# Patient Record
Sex: Male | Born: 1995 | Race: White | Hispanic: No | Marital: Married | Smoking: Never smoker
Health system: Southern US, Community
[De-identification: ages and names within clinical notes are randomized; demographics above are authoritative.]

---
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
## 2017-04-09 ENCOUNTER — Ambulatory Visit: Payer: Self-pay | Admitting: Medical

## 2017-04-09 DIAGNOSIS — Z021 Encounter for pre-employment examination: Secondary | ICD-10-CM

## 2017-04-09 DIAGNOSIS — E785 Hyperlipidemia, unspecified: Secondary | ICD-10-CM

## 2017-04-09 DIAGNOSIS — H9192 Unspecified hearing loss, left ear: Secondary | ICD-10-CM

## 2017-04-09 NOTE — Progress Notes (Signed)
   Subjective:    Patient ID: Thomas Keith, male    DOB: 15-Jan-1996, 22 y.o.   MRN: [id number]  HPI  22 yo male in non acute distress here for [employer] physical. 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    144/82 HR70  Review of Systems  Constitutional: Negative for chills and fever.  HENT: Negative for congestion, ear pain and sore throat.   Respiratory: Negative for cough and shortness of breath.   Cardiovascular: Negative for chest pain.  Genitourinary: Negative for dysuria.  Musculoskeletal: Negative for myalgias.  Skin: Negative for rash.  Neurological: Negative for dizziness, syncope and light-headedness.       Objective:   Physical Exam  Constitutional: He is oriented to person, place, and time. Vital signs are normal. He appears well-developed and well-nourished.  HENT:  Head: Normocephalic and atraumatic.  Right Ear: External ear normal.  Left Ear: External ear normal.  Nose: Nose normal.  Mouth/Throat: Oropharynx is clear and moist.  Eyes: Conjunctivae, EOM and lids are normal. Pupils are equal, round, and reactive to light.  Neck: Trachea normal and normal range of motion. Neck supple. No thyromegaly present.  Cardiovascular: Normal rate, regular rhythm and normal heart sounds.  Pulmonary/Chest: Effort normal and breath sounds normal.  Abdominal: Soft. Bowel sounds are normal.  Musculoskeletal: Normal range of motion.  Lymphadenopathy:    He has no cervical adenopathy.  Neurological: He is alert and oriented to person, place, and time. He has normal strength. No cranial nerve deficit or sensory deficit. He displays a negative Romberg sign. GCS eye subscore is 4. GCS verbal subscore is 5. GCS motor subscore is 6.  Reflex Scores:      Patellar reflexes are 2+ on the right side and 2+ on the left side.      Achilles reflexes are 2+ on the right side and 2+ on the left side. Skin: Skin is warm, dry and  intact.  Psychiatric: He has a normal mood and affect. His speech is normal and behavior is normal. Judgment and thought content normal. Cognition and memory are normal.  Nursing note and vitals reviewed.   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      Assessment & Plan:  Sheriff's Physical pre-employment  Reviewed labs with patient, Hyperlipidemia Decreased hearing left ear per our hearing test.Will get clearance letter. Patient to bring in clearance note for hearing from the [employer]. Will review and discuss with Dr. Sullivan LoneGilbert. 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 Audiology of the left ear on exam here abnormal. Information given to patient on cholesterol.  GF [place] [employer] [place]

## 2017-04-09 NOTE — Patient Instructions (Signed)

## 2017-07-15 ENCOUNTER — Encounter

## 2017-07-15 ENCOUNTER — Ambulatory Visit: Payer: Self-pay | Admitting: Family Medicine

## 2017-08-12 ENCOUNTER — Ambulatory Visit: Payer: Self-pay | Admitting: Family Medicine

## 2018-11-24 ENCOUNTER — Encounter: Payer: Self-pay | Admitting: Adult Health

## 2018-12-13 ENCOUNTER — Other Ambulatory Visit: Payer: Self-pay

## 2018-12-13 ENCOUNTER — Encounter: Payer: Self-pay | Admitting: Adult Health

## 2018-12-13 ENCOUNTER — Ambulatory Visit: Payer: Managed Care, Other (non HMO) | Admitting: Adult Health

## 2018-12-13 VITALS — BP 138/62 | HR 77 | Temp 97.2°F | Resp 16 | Ht 66.0 in | Wt 236.0 lb

## 2018-12-13 DIAGNOSIS — Z0189 Encounter for other specified special examinations: Secondary | ICD-10-CM | POA: Diagnosis not present

## 2018-12-13 DIAGNOSIS — Z008 Encounter for other general examination: Secondary | ICD-10-CM

## 2018-12-13 NOTE — Patient Instructions (Addendum)
I will have the office call you on your glucose and cholesterol results when they return if you have not heard within 1 week please call the office.  This biometric physical is a brief physical and the only labs done are glucose and your lipid panel(cholesterol) and is  not a substitute for seeing a primary care provider for a complete annual physical. Please see a primary care physician for routine health maintenance, labs and full physical at least yearly and follow up as recommended by your provider. Provider also recommends if you do not have a primary care provider for patient to establish care as soon as possible .Patient may chose provider of choice. Also gave the [place]  PHYSICIAN/PROVIDER  REFERRAL LINE at [phone] or web site at [email or web address] to help assist with finding a primary care doctor.  Patient verbalizes understanding that his office is acute care only and not a substitute for a primary care or for the management of chronic conditions.    The Biometric exam is a brief physical with labs including glucose and cholesterol. This does not replace a full physical with a primary care provider, and additional recommended labs. This is an acute care clinic not for maintenance of chronic or long standing conditions.   Provider also recommends if you do not have a primary care provider for patient to establish care promptly.You can choose any provider of your choice at any facility of your choice, the below information is  just a resource to aid in you finding a primary care provider for routine health maintenance.   [place]  PHYSICIAN/PROVIDER  REFERRAL LINE at [phone]  [email or web address] to help assist with finding a primary care doctor.   Helpful resources below of other primary care office's accepting new patients.   [facility]         [address]  [place]. [place] [address] [phone]  . [facility]    [address], [address] [place], [place] [address] [phone]  . [facility] [address]  [place], [place]  [phone]   . [facility]  [address] [place] [address] [phone]    Follow up with primary care as needed for chronic and maintenance health care- can be seen in this employee clinic for acute care.   I will have the office call you on your glucose and cholesterol results when they return if you have not heard within 1 week please call the office.  This biometric physical is a brief physical and the only labs done are glucose and your lipid panel(cholesterol) and is  not a substitute for seeing a primary care provider for a complete annual physical. Please see a primary care physician for routine health maintenance, labs and full physical at least yearly and follow up as recommended by your provider. Provider also recommends if you do not have a primary care provider for patient to establish care as soon as possible .Patient may chose provider of choice. Also gave the [place]  PHYSICIAN/PROVIDER  REFERRAL LINE at [phone] or web site at [email or web address] to help assist with finding a primary care doctor.  Patient verbalizes understanding that his office is acute care only and not a substitute for a primary care or for the management of chronic conditions.    Health Maintenance, Male Adopting a healthy lifestyle and getting preventive  care are important in promoting health and wellness. Ask your health care provider about:  The right schedule for you to have regular tests and exams.  Things you can do on your own to prevent diseases and keep yourself healthy. What should I know about diet, weight, and exercise? Eat a healthy diet   Eat a diet that includes plenty of vegetables, fruits, low-fat dairy products, and lean protein.  Do not eat a lot of foods that are high in  solid fats, added sugars, or sodium. Maintain a healthy weight Body mass index (BMI) is a measurement that can be used to identify possible weight problems. It estimates body fat based on height and weight. Your health care provider can help determine your BMI and help you achieve or maintain a healthy weight. Get regular exercise Get regular exercise. This is one of the most important things you can do for your health. Most adults should:  Exercise for at least 150 minutes each week. The exercise should increase your heart rate and make you sweat (moderate-intensity exercise).  Do strengthening exercises at least twice a week. This is in addition to the moderate-intensity exercise.  Spend less time sitting. Even light physical activity can be beneficial. Watch cholesterol and blood lipids Have your blood tested for lipids and cholesterol at 24 years of age, then have this test every 5 years. You may need to have your cholesterol levels checked more often if:  Your lipid or cholesterol levels are high.  You are older than 24 years of age.  You are at high risk for heart disease. What should I know about cancer screening? Many types of cancers can be detected early and may often be prevented. Depending on your health history and family history, you may need to have cancer screening at various ages. This may include screening for:  Colorectal cancer.  Prostate cancer.  Skin cancer.  Lung cancer. What should I know about heart disease, diabetes, and high blood pressure? Blood pressure and heart disease  High blood pressure causes heart disease and increases the risk of stroke. This is more likely to develop in people who have high blood pressure readings, are of African descent, or are overweight.  Talk with your health care provider about your target blood pressure readings.  Have your blood pressure checked: ? Every 3-5 years if you are 8-38 years of age. ? Every year if you  are 15 years old or older.  If you are between the ages of 93 and 33 and are a current or former smoker, ask your health care provider if you should have a one-time screening for abdominal aortic aneurysm (AAA). Diabetes Have regular diabetes screenings. This checks your fasting blood sugar level. Have the screening done:  Once every three years after age 27 if you are at a normal weight and have a low risk for diabetes.  More often and at a younger age if you are overweight or have a high risk for diabetes. What should I know about preventing infection? Hepatitis B If you have a higher risk for hepatitis B, you should be screened for this virus. Talk with your health care provider to find out if you are at risk for hepatitis B infection. Hepatitis C Blood testing is recommended for:  Everyone born from [birth year] through 1966.  Anyone with known risk factors for hepatitis C. Sexually transmitted infections (STIs)  You should be screened each year for STIs, including gonorrhea and chlamydia, if: ? You  are sexually active and are younger than 24 years of age. ? You are older than 24 years of age and your health care provider tells you that you are at risk for this type of infection. ? Your sexual activity has changed since you were last screened, and you are at increased risk for chlamydia or gonorrhea. Ask your health care provider if you are at risk.  Ask your health care provider about whether you are at high risk for HIV. Your health care provider may recommend a prescription medicine to help prevent HIV infection. If you choose to take medicine to prevent HIV, you should first get tested for HIV. You should then be tested every 3 months for as long as you are taking the medicine. Follow these instructions at home: Lifestyle  Do not use any products that contain nicotine or tobacco, such as cigarettes, e-cigarettes, and chewing tobacco. If you need help quitting, ask your health care  provider.  Do not use street drugs.  Do not share needles.  Ask your health care provider for help if you need support or information about quitting drugs. Alcohol use  Do not drink alcohol if your health care provider tells you not to drink.  If you drink alcohol: ? Limit how much you have to 0-2 drinks a day. ? Be aware of how much alcohol is in your drink. In the [place] one drink equals one 12 oz bottle of beer (355 mL), one 5 oz glass of wine (148 mL), or one 1 oz glass of hard liquor (44 mL). General instructions  Schedule regular health, dental, and eye exams.  Stay current with your vaccines.  Tell your health care provider if: ? You often feel depressed. ? You have ever been abused or do not feel safe at home. Summary  Adopting a healthy lifestyle and getting preventive care are important in promoting health and wellness.  Follow your health care provider's instructions about healthy diet, exercising, and getting tested or screened for diseases.  Follow your health care provider's instructions on monitoring your cholesterol and blood pressure. This information is not intended to replace advice given to you by your health care provider. Make sure you discuss any questions you have with your health care provider. Document Released: 08/30/2007 Document Revised: 02/24/2018 Document Reviewed: 02/24/2018 Elsevier Patient Education  2020 [employer]

## 2018-12-13 NOTE — Progress Notes (Signed)
[place] DOB: 23 y.o. MRN: [id number]  Subjective:  Here for Biometric Screen/brief exam Patient is a 23 year old male in no acute distress who comes to the clinic for his biometric brief exam and biometric screening.   He reports he has gained 20 lbs since the start of the Covid 19 pandemic.  He reports his gym was closed but he has in the past two weeks started a home work out with [employer].  He does not smoke or vape and reports never has.   Patient  denies any fever, body aches,chills, rash, chest pain, shortness of breath, nausea, vomiting, or diarrhea.  He denies any concerns at today's visit.  He sees primary care regularly for health maintenance.  Objective: Blood pressure 138/62, pulse 77, temperature (!) 97.2 F (36.2 C), temperature source Temporal, resp. rate 16, height 5\' 6"  (1.676 m), weight 236 lb (107 kg), SpO2 98 %. NAD, well developed, well nourished HEENT: Within normal limits Neck: Normal, supple, no cervical lymphadenopathy  Heart: Regular rate and rhythm without murmurs, rubs and gallops  Lungs: Clear to auscultation without ant adventitious lung sounds  Assessment: Biometric screen   ICD-10-CM   1. Encounter for other general examination-not full annual physical- biometric screening and brief exam   Z00.8 Glucose, random    Lipid Panel With LDL/HDL Ratio  2. Encounter for biometric screening  Z01.89 Glucose, random    Lipid Panel With LDL/HDL Ratio     Plan:  Fasting glucose and lipids.- labs not fasting he ate  Discussed with patient that today's visit here is a limited biometric screening visit (not a comprehensive exam or management of any chronic problems) Discussed some health issues, including healthy eating habits and exercise. Encouraged to follow-up with PCP for annual comprehensive preventive and wellness care (and if applicable, any chronic issues). Questions invited and  answered.   I will have the office call you on your glucose and cholesterol results when they return if you have not heard within 1 week please call the office.  This biometric physical is a brief physical and the only labs done are glucose and your lipid panel(cholesterol) and is  not a substitute for seeing a primary care provider for a complete annual physical. Please see a primary care physician for routine health maintenance, labs and full physical at least yearly and follow up as recommended by your provider. Provider also recommends if you do not have a primary care provider for patient to establish care as soon as possible .Patient may chose provider of choice. Also gave the [place] at [phone] or web site at [email or web address] to help assist with finding a primary care doctor.  Patient verbalizes understanding that his office is acute care only and not a substitute for a primary care or for the management of chronic conditions.

## 2018-12-13 NOTE — Progress Notes (Signed)
The patient was not fasting for his lab draw

## 2018-12-14 ENCOUNTER — Encounter: Payer: Self-pay | Admitting: Adult Health

## 2018-12-14 LAB — LIPID PANEL WITH LDL/HDL RATIO
Cholesterol, Total: 254 mg/dL — ABNORMAL HIGH (ref 100–199)
HDL: 33 mg/dL — ABNORMAL LOW (ref >39)
LDL Chol Calc (NIH): 184 mg/dL — ABNORMAL HIGH (ref 0–99)
LDL/HDL Ratio: 5.6 ratio — ABNORMAL HIGH (ref 0.0–3.6)
Triglycerides: 194 mg/dL — ABNORMAL HIGH (ref 0–149)
VLDL Cholesterol Cal: 37 mg/dL (ref 5–40)

## 2018-12-14 LAB — GLUCOSE, RANDOM: Glucose: 97 mg/dL (ref 65–99)

## 2018-12-15 ENCOUNTER — Encounter: Payer: Self-pay | Admitting: Adult Health

## 2019-10-26 ENCOUNTER — Encounter: Payer: Self-pay | Admitting: Nurse Practitioner

## 2019-10-26 ENCOUNTER — Ambulatory Visit: Payer: Managed Care, Other (non HMO) | Admitting: Nurse Practitioner

## 2019-10-26 ENCOUNTER — Other Ambulatory Visit: Payer: Self-pay

## 2019-10-26 VITALS — BP 138/88 | HR 76 | Temp 98.1°F | Resp 16 | Ht 68.0 in | Wt 228.0 lb

## 2019-10-26 DIAGNOSIS — Z008 Encounter for other general examination: Secondary | ICD-10-CM

## 2019-10-26 DIAGNOSIS — Z0000 Encounter for general adult medical examination without abnormal findings: Secondary | ICD-10-CM | POA: Diagnosis not present

## 2019-10-26 NOTE — Progress Notes (Signed)
  Subjective:     Patient ID: Thomas Keith, male   DOB: [birth year], 24 y.o.   MRN: [id number]  HPI Thomas Keith is a 24 y.o. male employed by the [employer]. He has been working there for 3 years. Employee is here today for his annual biometric screening exam. He denies any problems today. He reports being on an exercise and weight program and has lost about 30 pounds.   PMH: Fracture right hand  Surgery: none SH:  Denies use of tobacco, ETOH or drugs  Meds: None Allergies: None   Immunizations: Has not had Covid vaccine yet  Diet/Exercise: currently on weight loss program with diet and exercise.  Review of Systems  All other systems reviewed and are negative. Employee denies any problems today.     Objective:BP 138/88 (BP Location: Right Arm, Patient Position: Sitting, Cuff Size: Normal)   Pulse 76   Temp 98.1 F (36.7 C) (Temporal)   Resp 16   Ht 5\' 8"  (1.727 m)   Wt 228 lb (103.4 kg)   SpO2 98%   BMI 34.67 kg/m     Physical Exam Constitutional:      General: He is not in acute distress.    Appearance: Normal appearance.  HENT:     Head: Normocephalic and atraumatic.     Right Ear: Tympanic membrane and ear canal normal.     Left Ear: Tympanic membrane normal.     Nose: Nose normal.     Mouth/Throat:     Mouth: Mucous membranes are moist.     Pharynx: Oropharynx is clear.  Eyes:     Conjunctiva/sclera: Conjunctivae normal.  Neck:     Thyroid: No thyroid mass or thyroid tenderness.  Cardiovascular:     Rate and Rhythm: Normal rate and regular rhythm.  Pulmonary:     Effort: Pulmonary effort is normal.     Breath sounds: Normal breath sounds and air entry.  Abdominal:     Palpations: Abdomen is soft.     Tenderness: There is no abdominal tenderness.  Musculoskeletal:        General: Normal range of motion.     Cervical back: Normal range of motion. No tenderness.  Skin:    General: Skin is warm and dry.  Neurological:     Mental Status: He  is alert.     Motor: No weakness or pronator drift.     Coordination: Romberg sign negative.     Gait: Gait is intact.     Comments: Ambulatory with steady gait, stands on one foot without difficulty. Radial pulses 2+, grips are equal.   Psychiatric:        Mood and Affect: Mood normal.        Thought Content: Thought content normal.        Assessment:    23 y.o. male here for annual biometric screening exam. Limited exam today is normal.     Plan:    Discussed with the employee diet and exercise. He has received a letter giving him information on the Covid vaccine and where to get it. Employee given opportunity to ask questions. All questions answered and instructions given to employee verbally and in witting. Employee to return in one year or sooner if needed.

## 2019-10-26 NOTE — Patient Instructions (Signed)
Exercising to Stay Healthy To become healthy and stay healthy, it is recommended that you do moderate-intensity and vigorous-intensity exercise. You can tell that you are exercising at a moderate intensity if your heart starts beating faster and you start breathing faster but can still hold a conversation. You can tell that you are exercising at a vigorous intensity if you are breathing much harder and faster and cannot hold a conversation while exercising. Exercising regularly is important. It has many health benefits, such as:  Improving overall fitness, flexibility, and endurance.  Increasing bone density.  Helping with weight control.  Decreasing body fat.  Increasing muscle strength.  Reducing stress and tension.  Improving overall health. How often should I exercise? Choose an activity that you enjoy, and set realistic goals. Your health care provider can help you make an activity plan that works for you. Exercise regularly as told by your health care provider. This may include:  Doing strength training two times a week, such as: ? Lifting weights. ? Using resistance bands. ? Push-ups. ? Sit-ups. ? Yoga.  Doing a certain intensity of exercise for a given amount of time. Choose from these options: ? A total of 150 minutes of moderate-intensity exercise every week. ? A total of 75 minutes of vigorous-intensity exercise every week. ? A mix of moderate-intensity and vigorous-intensity exercise every week. Children, pregnant women, people who have not exercised regularly, people who are overweight, and older adults may need to talk with a health care provider about what activities are safe to do. If you have a medical condition, be sure to talk with your health care provider before you start a new exercise program. What are some exercise ideas? Moderate-intensity exercise ideas include:  Walking 1 mile (1.6 km) in about 15  minutes.  Biking.  Hiking.  Golfing.  Dancing.  Water aerobics. Vigorous-intensity exercise ideas include:  Walking 4.5 miles (7.2 km) or more in about 1 hour.  Jogging or running 5 miles (8 km) in about 1 hour.  Biking 10 miles (16.1 km) or more in about 1 hour.  Lap swimming.  Roller-skating or in-line skating.  Cross-country skiing.  Vigorous competitive sports, such as football, basketball, and soccer.  Jumping rope.  Aerobic dancing. What are some everyday activities that can help me to get exercise?  Yard work, such as: ? Pushing a lawn mower. ? Raking and bagging leaves.  Washing your car.  Pushing a stroller.  Shoveling snow.  Gardening.  Washing windows or floors. How can I be more active in my day-to-day activities?  Use stairs instead of an elevator.  Take a walk during your lunch break.  If you drive, park your car farther away from your work or school.  If you take public transportation, get off one stop early and walk the rest of the way.  Stand up or walk around during all of your indoor phone calls.  Get up, stretch, and walk around every 30 minutes throughout the day.  Enjoy exercise with a friend. Support to continue exercising will help you keep a regular routine of activity. What guidelines can I follow while exercising?  Before you start a new exercise program, talk with your health care provider.  Do not exercise so much that you hurt yourself, feel dizzy, or get very short of breath.  Wear comfortable clothes and wear shoes with good support.  Drink plenty of water while you exercise to prevent dehydration or heat stroke.  Work out until your breathing   and your heartbeat get faster. Where to find more information  [place] Department of Health and Human Services: www.hhs.gov  Centers for Disease Control and Prevention (CDC): www.cdc.gov Summary  Exercising regularly is important. It will improve your overall fitness,  flexibility, and endurance.  Regular exercise also will improve your overall health. It can help you control your weight, reduce stress, and improve your bone density.  Do not exercise so much that you hurt yourself, feel dizzy, or get very short of breath.  Before you start a new exercise program, talk with your health care provider. This information is not intended to replace advice given to you by your health care provider. Make sure you discuss any questions you have with your health care provider. Document Revised: 02/13/2017 Document Reviewed: 01/22/2017 Elsevier Patient Education  2020 [employer] Healthy Eating Following a healthy eating pattern may help you to achieve and maintain a healthy body weight, reduce the risk of chronic disease, and live a long and productive life. It is important to follow a healthy eating pattern at an appropriate calorie level for your body. Your nutritional needs should be met primarily through food by choosing a variety of nutrient-rich foods. What are tips for following this plan? Reading food labels  Read labels and choose the following: ? Reduced or low sodium. ? Juices with 100% fruit juice. ? Foods with low saturated fats and high polyunsaturated and monounsaturated fats. ? Foods with whole grains, such as whole wheat, cracked wheat, brown rice, and wild rice. ? Whole grains that are fortified with folic acid. This is recommended for women who are pregnant or who want to become pregnant.  Read labels and avoid the following: ? Foods with a lot of added sugars. These include foods that contain brown sugar, corn sweetener, corn syrup, dextrose, fructose, glucose, high-fructose corn syrup, honey, invert sugar, lactose, malt syrup, maltose, molasses, raw sugar, sucrose, trehalose, or turbinado sugar.  Do not eat more than the following amounts of added sugar per day:  6 teaspoons (25 g) for women.  9 teaspoons (38 g) for men. ? Foods that  contain processed or refined starches and grains. ? Refined grain products, such as white flour, degermed cornmeal, white bread, and white rice. Shopping  Choose nutrient-rich snacks, such as vegetables, whole fruits, and nuts. Avoid high-calorie and high-sugar snacks, such as potato chips, fruit snacks, and candy.  Use oil-based dressings and spreads on foods instead of solid fats such as butter, stick margarine, or cream cheese.  Limit pre-made sauces, mixes, and "instant" products such as flavored rice, instant noodles, and ready-made pasta.  Try more plant-protein sources, such as tofu, tempeh, black beans, edamame, lentils, nuts, and seeds.  Explore eating plans such as the Mediterranean diet or vegetarian diet. Cooking  Use oil to saut or stir-fry foods instead of solid fats such as butter, stick margarine, or lard.  Try baking, boiling, grilling, or broiling instead of frying.  Remove the fatty part of meats before cooking.  Steam vegetables in water or broth. Meal planning   At meals, imagine dividing your plate into fourths: ? One-half of your plate is fruits and vegetables. ? One-fourth of your plate is whole grains. ? One-fourth of your plate is protein, especially lean meats, poultry, eggs, tofu, beans, or nuts.  Include low-fat dairy as part of your daily diet. Lifestyle  Choose healthy options in all settings, including home, work, school, restaurants, or stores.  Prepare your food safely: ? Wash your hands   after handling raw meats. ? Keep food preparation surfaces clean by regularly washing with hot, soapy water. ? Keep raw meats separate from ready-to-eat foods, such as fruits and vegetables. ? Cook seafood, meat, poultry, and eggs to the recommended internal temperature. ? Store foods at safe temperatures. In general:  Keep cold foods at 40F (4.4C) or below.  Keep hot foods at 140F (60C) or above.  Keep your freezer at 0F (-17.8C) or  below.  Foods are no longer safe to eat when they have been between the temperatures of 40-140F (4.4-60C) for more than 2 hours. What foods should I eat? Fruits Aim to eat 2 cup-equivalents of fresh, canned (in natural juice), or frozen fruits each day. Examples of 1 cup-equivalent of fruit include 1 small apple, 8 large strawberries, 1 cup canned fruit,  cup dried fruit, or 1 cup 100% juice. Vegetables Aim to eat 2-3 cup-equivalents of fresh and frozen vegetables each day, including different varieties and colors. Examples of 1 cup-equivalent of vegetables include 2 medium carrots, 2 cups raw, leafy greens, 1 cup chopped vegetable (raw or cooked), or 1 medium baked potato. Grains Aim to eat 6 ounce-equivalents of whole grains each day. Examples of 1 ounce-equivalent of grains include 1 slice of bread, 1 cup ready-to-eat cereal, 3 cups popcorn, or  cup cooked rice, pasta, or cereal. Meats and other proteins Aim to eat 5-6 ounce-equivalents of protein each day. Examples of 1 ounce-equivalent of protein include 1 egg, 1/2 cup nuts or seeds, or 1 tablespoon (16 g) peanut butter. A cut of meat or fish that is the size of a deck of cards is about 3-4 ounce-equivalents.  Of the protein you eat each week, try to have at least 8 ounces come from seafood. This includes salmon, trout, herring, and anchovies. Dairy Aim to eat 3 cup-equivalents of fat-free or low-fat dairy each day. Examples of 1 cup-equivalent of dairy include 1 cup (240 mL) milk, 8 ounces (250 g) yogurt, 1 ounces (44 g) natural cheese, or 1 cup (240 mL) fortified soy milk. Fats and oils  Aim for about 5 teaspoons (21 g) per day. Choose monounsaturated fats, such as canola and olive oils, avocados, peanut butter, and most nuts, or polyunsaturated fats, such as sunflower, corn, and soybean oils, walnuts, pine nuts, sesame seeds, sunflower seeds, and flaxseed. Beverages  Aim for six 8-oz glasses of water per day. Limit coffee to three  to five 8-oz cups per day.  Limit caffeinated beverages that have added calories, such as soda and energy drinks.  Limit alcohol intake to no more than 1 drink a day for nonpregnant women and 2 drinks a day for men. One drink equals 12 oz of beer (355 mL), 5 oz of wine (148 mL), or 1 oz of hard liquor (44 mL). Seasoning and other foods  Avoid adding excess amounts of salt to your foods. Try flavoring foods with herbs and spices instead of salt.  Avoid adding sugar to foods.  Try using oil-based dressings, sauces, and spreads instead of solid fats. This information is based on general [place] nutrition guidelines. For more information, visit choosemyplate.gov. Exact amounts may vary based on your nutrition needs. Summary  A healthy eating plan may help you to maintain a healthy weight, reduce the risk of chronic diseases, and stay active throughout your life.  Plan your meals. Make sure you eat the right portions of a variety of nutrient-rich foods.  Try baking, boiling, grilling, or broiling instead of frying.    Choose healthy options in all settings, including home, work, school, restaurants, or stores. This information is not intended to replace advice given to you by your health care provider. Make sure you discuss any questions you have with your health care provider. Document Revised: 06/15/2017 Document Reviewed: 06/15/2017 Elsevier Patient Education  2020 Elsevier Inc.  

## 2019-10-27 LAB — LIPID PANEL
Chol/HDL Ratio: 6 ratio — ABNORMAL HIGH (ref 0.0–5.0)
Cholesterol, Total: 199 mg/dL (ref 100–199)
HDL: 33 mg/dL — ABNORMAL LOW (ref >39)
LDL Chol Calc (NIH): 144 mg/dL — ABNORMAL HIGH (ref 0–99)
Triglycerides: 123 mg/dL (ref 0–149)
VLDL Cholesterol Cal: 22 mg/dL (ref 5–40)

## 2019-10-27 LAB — GLUCOSE, RANDOM: Glucose: 96 mg/dL (ref 65–99)

## 2020-11-21 ENCOUNTER — Other Ambulatory Visit: Payer: Self-pay

## 2020-11-21 ENCOUNTER — Emergency Department: Payer: No Typology Code available for payment source

## 2020-11-21 ENCOUNTER — Encounter: Payer: Self-pay | Admitting: Emergency Medicine

## 2020-11-21 ENCOUNTER — Emergency Department
Admission: EM | Admit: 2020-11-21 | Discharge: 2020-11-21 | Disposition: A | Discharge status: Home / Self Care | Payer: No Typology Code available for payment source | Attending: Emergency Medicine | Admitting: Emergency Medicine

## 2020-11-21 DIAGNOSIS — R1010 Upper abdominal pain, unspecified: Secondary | ICD-10-CM | POA: Diagnosis not present

## 2020-11-21 DIAGNOSIS — N50812 Left testicular pain: Secondary | ICD-10-CM | POA: Diagnosis not present

## 2020-11-21 DIAGNOSIS — R52 Pain, unspecified: Secondary | ICD-10-CM

## 2020-11-21 MED ORDER — MELOXICAM 15 MG PO TABS: 15.0000 mg | ORAL_TABLET | Freq: Every day | ORAL | 0 refills | Status: AC

## 2020-11-21 MED ORDER — KETOROLAC TROMETHAMINE 30 MG/ML IJ SOLN
30.0000 mg | Freq: Once | INTRAMUSCULAR | Status: AC
  Administered 2020-11-21: 30 mg via INTRAMUSCULAR
  Filled 2020-11-21: qty 1

## 2020-11-21 NOTE — ED Triage Notes (Signed)
Pt reports that he developed all the sudden left testicular pain. Denies any blood in urine or pain when he urinates.

## 2020-11-21 NOTE — ED Provider Notes (Signed)
[facility] Emergency Department Provider Note  ____________________________________________  Time seen: Approximately 9:16 PM  I have reviewed the triage vital signs and the nursing notes.   HISTORY  Chief Complaint Abdominal Pain and Testicle Pain    HPI Thomas Keith is a 25 y.o. male who presents emergency department for evaluation of left testicular pain.  Pain began rather quickly this evening.  No reported trauma.  Patient denies any dysuria, polyuria or hematuria.  Patient states that he has been working out slightly more than normal but nothing atypical.  Patient denied any nausea, vomiting, diarrhea or constipation.  Patient states that he is involved with only his wife sexually, has had unprotected sex but states that there is no concern for STD.  No fevers or chills, URI symptoms.  No history of inguinal hernia.  Again no history of testicular problems.       History reviewed. No pertinent past medical history.  There are no problems to display for this patient.   No past surgical history on file.  Prior to Admission medications   Medication Sig Start Date End Date Taking? Authorizing Provider  meloxicam (MOBIC) 15 MG tablet Take 1 tablet (15 mg total) by mouth daily. 11/21/20  Yes Azahel Belcastro, Delorise Royals, PA-C    Allergies Patient has no known allergies.  No family history on file.  Social History Social History   Tobacco Use   Smoking status: Never   Smokeless tobacco: Never  Substance Use Topics   Alcohol use: No   Drug use: Never     Review of Systems  Constitutional: No fever/chills Eyes: No visual changes. No discharge ENT: No upper respiratory complaints. Cardiovascular: no chest pain. Respiratory: no cough. No SOB. Gastrointestinal: No abdominal pain.  No nausea, no vomiting.  No diarrhea.  No constipation. Genitourinary: Negative for dysuria. No hematuria.  Positive for left testicle pain Musculoskeletal: Negative for  musculoskeletal pain. Skin: Negative for rash, abrasions, lacerations, ecchymosis. Neurological: Negative for headaches, focal weakness or numbness.  10 System ROS otherwise negative.  ____________________________________________   PHYSICAL EXAM:  VITAL SIGNS: ED Triage Vitals  Enc Vitals Group     BP 11/21/20 1830 (!) 142/81     Pulse Rate 11/21/20 1829 90     Resp 11/21/20 1829 18     Temp 11/21/20 1829 99.5 F (37.5 C)     Temp Source 11/21/20 1829 Oral     SpO2 11/21/20 1829 100 %     Weight 11/21/20 1830 240 lb (108.9 kg)     Height 11/21/20 1830 5\' 6"  (1.676 m)     Head Circumference --      Peak Flow --      Pain Score 11/21/20 1830 7     Pain Loc --      Pain Edu? --      Excl. in GC? --      Constitutional: Alert and oriented. Well appearing and in no acute distress. Eyes: Conjunctivae are normal. PERRL. EOMI. Head: Atraumatic. ENT:      Ears:       Nose: No congestion/rhinnorhea.      Mouth/Throat: Mucous membranes are moist.  Neck: No stridor.    Cardiovascular: Normal rate, regular rhythm. Normal S1 and S2.  Good peripheral circulation. Respiratory: Normal respiratory effort without tachypnea or retractions. Lungs CTAB. Good air entry to the bases with no decreased or absent breath sounds. Gastrointestinal: Bowel sounds 4 quadrants. Soft and nontender to palpation. No guarding or rigidity.  No palpable masses. No distention. No CVA tenderness. No visible external genital findings.  Slightly tender to palpation left no palpable findings.  No genital lesions.  No significant tenderness in the inguinal fold and no palpable inguinal hernia. Musculoskeletal: Full range of motion to all extremities. No gross deformities appreciated. Neurologic:  Normal speech and language. No gross focal neurologic deficits are appreciated.  Skin:  Skin is warm, dry and intact. No rash noted. Psychiatric: Mood and affect are normal. Speech and behavior are normal. Patient exhibits  appropriate insight and judgement.   ____________________________________________   LABS (all labs ordered are listed, but only abnormal results are displayed)  Labs Reviewed - No data to display  ____________________________________________  EKG   ____________________________________________  RADIOLOGY I personally viewed and evaluated these images as part of my medical decision making, as well as reviewing the written report by the radiologist.  ED Provider Interpretation: Evaluation of scrotum and testicles with ultrasound revealed no acute findings  US SCROTUM W/DOPPLER  Result Date: 11/21/2020 CLINICAL DATA:  Acute scrotal pain EXAM: SCROTAL ULTRASOUND DOPPLER ULTRASOUND OF THE TESTICLES TECHNIQUE: Complete ultrasound examination of the testicles, epididymis, and other scrotal structures was performed. Color and spectral Doppler ultrasound were also utilized to evaluate blood flow to the testicles. COMPARISON:  None. FINDINGS: Right testicle Measurements: 4.4 x 2.2 x 3.3 cm. No mass or microlithiasis visualized. Left testicle Measurements: 4.6 x 2.2 x 3.3 cm. No mass or microlithiasis visualized. Right epididymis:  Normal in size and appearance. Left epididymis:  Normal in size and appearance. Hydrocele: None visualized. Physiologic fluid within the scrotal sac bilaterally. Varicocele:  None visualized. Pulsed Doppler interrogation of both testes demonstrates normal low resistance arterial and venous waveforms bilaterally. IMPRESSION: Normal testicular sonogram. Electronically Signed   By: Helyn Numbers M.D.   On: 11/21/2020 20:10    ____________________________________________    PROCEDURES  Procedure(s) performed:    Procedures    Medications  ketorolac (TORADOL) 30 MG/ML injection 30 mg (30 mg Intramuscular Given 11/21/20 2109)     ____________________________________________   INITIAL IMPRESSION / ASSESSMENT AND PLAN / ED COURSE  Pertinent labs & imaging  results that were available during my care of the patient were reviewed by me and considered in my medical decision making (see chart for details).  Review of the [place] CSRS was performed in accordance of the [facility] prior to dispensing any controlled drugs.           Patient's diagnosis is consistent with left testicle pain.  Patient presented to the emergency department complaining of left testicle pain.  No other symptoms at this time.  Ultrasound was reassuring.  I discussed the differential with the patient and his wife who is a PA.  At this time, with reassuring ultrasound patient and his wife do not feel like further investigation at this time is warranted.  I discussed the differential with the patient to include other issues such as abdominal symptoms, heat hernia, referred pain.  I offered labs, CT scan.  After discussion again both patient and his wife feel that there is no further need for evaluation with labs or imaging.  I have discussed return precautions at length with the patient.  Otherwise follow-up with primary care if symptoms persist.  Patient is wife verbalized understanding.  Patient will have anti-inflammatory to help alleviate symptoms..  Patient is given ED precautions to return to the ED for any worsening or new symptoms.     ____________________________________________  FINAL CLINICAL IMPRESSION(S) /  ED DIAGNOSES  Final diagnoses:  Pain  Pain in left testicle      NEW MEDICATIONS STARTED DURING THIS VISIT:  ED Discharge Orders          Ordered    meloxicam (MOBIC) 15 MG tablet  Daily        11/21/20 2117                This chart was dictated using voice recognition software/Dragon. Despite best efforts to proofread, errors can occur which can change the meaning. Any change was purely unintentional.    Racheal Patches, PA-C 11/21/20 2342    Georga Hacking, MD 11/22/20 [phone]

## 2021-12-12 IMAGING — US US SCROTUM W/ DOPPLER COMPLETE
1 series · 14 of 25 positions shown · non-contrast
Comparison: None.

CLINICAL DATA: Acute scrotal pain

EXAM:
SCROTAL ULTRASOUND
DOPPLER ULTRASOUND OF THE TESTICLES
TECHNIQUE: Complete ultrasound examination of the testicles, epididymis, and
other scrotal structures was performed. Color and spectral Doppler
ultrasound were also utilized to evaluate blood flow to the
testicles.

[Series 1: us scrotum w/doppler · 14 of 67 slices shown]
[im 1/67]
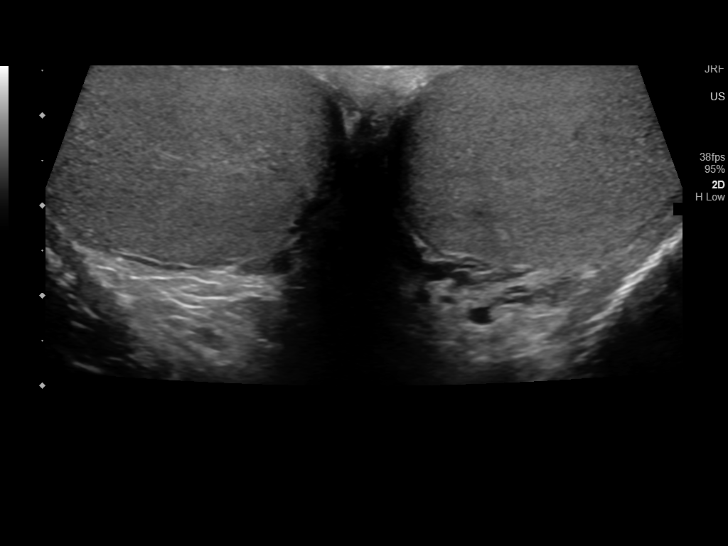
[im 6/67]
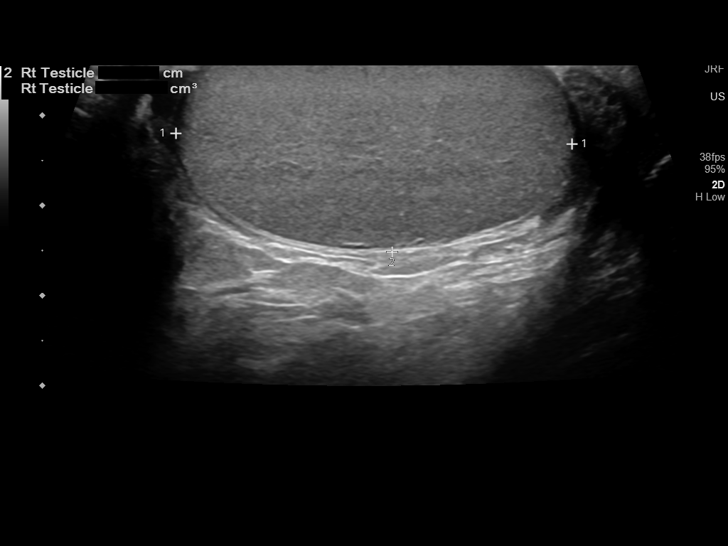
[im 12/67]
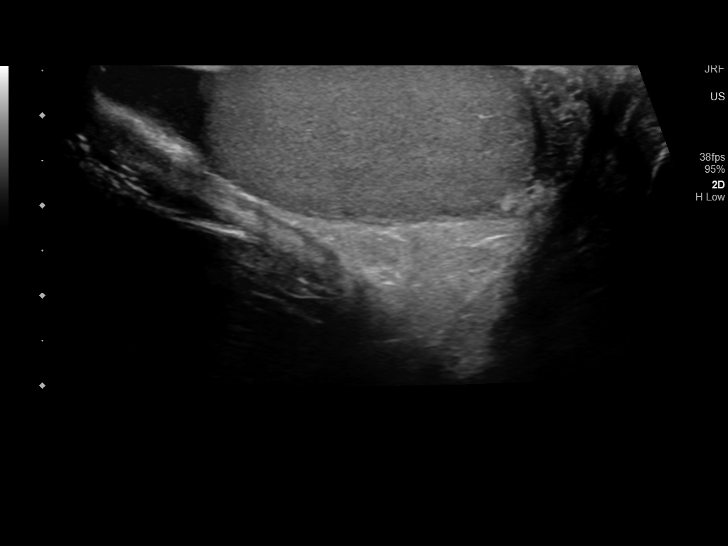
[im 17/67]
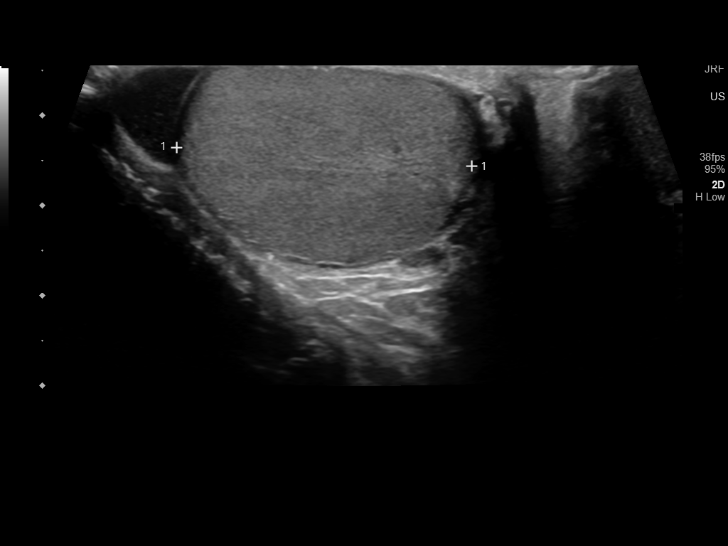
[im 23/67]
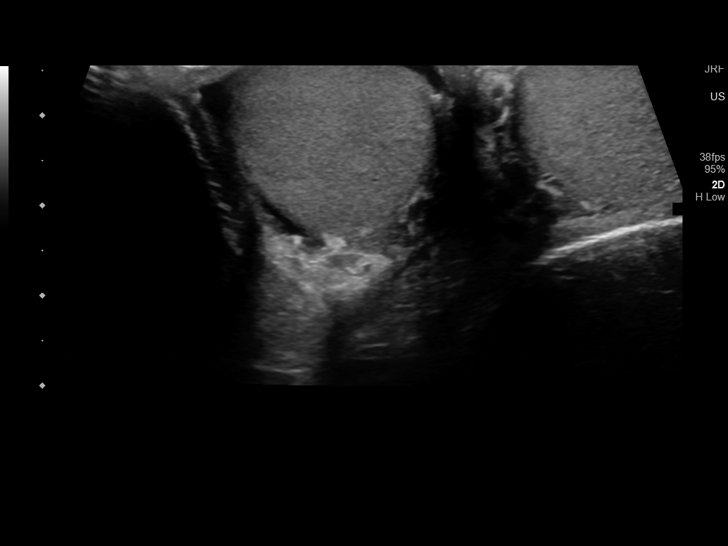
[im 25/67]
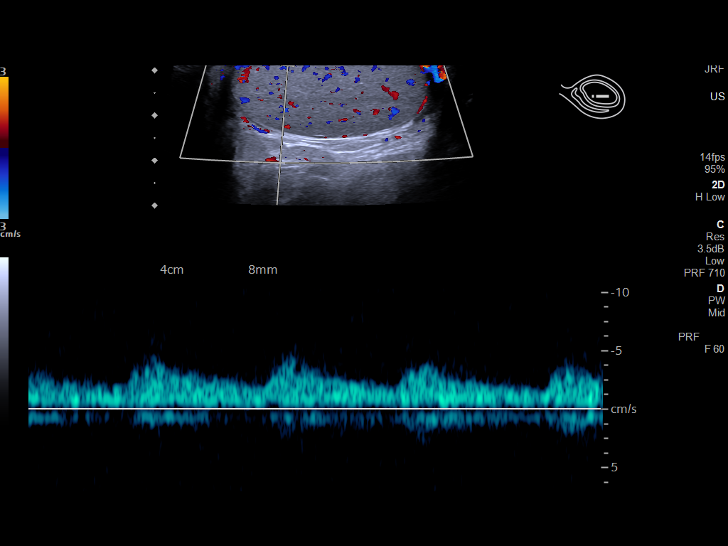
[im 31/67]
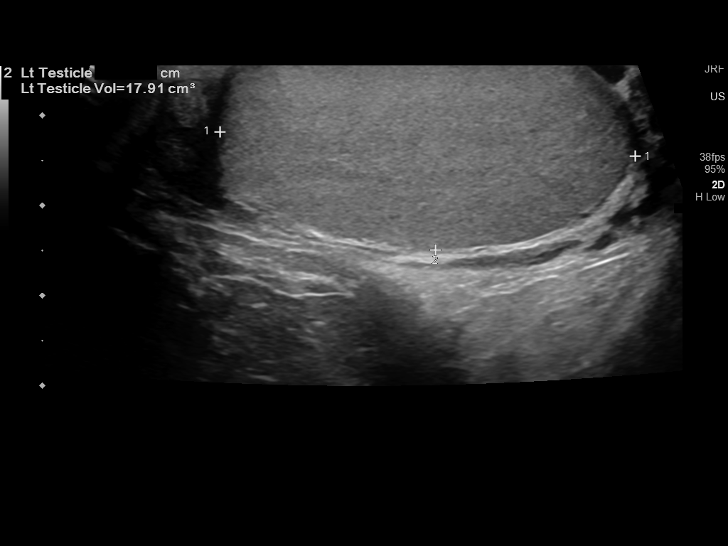
[im 36/67]
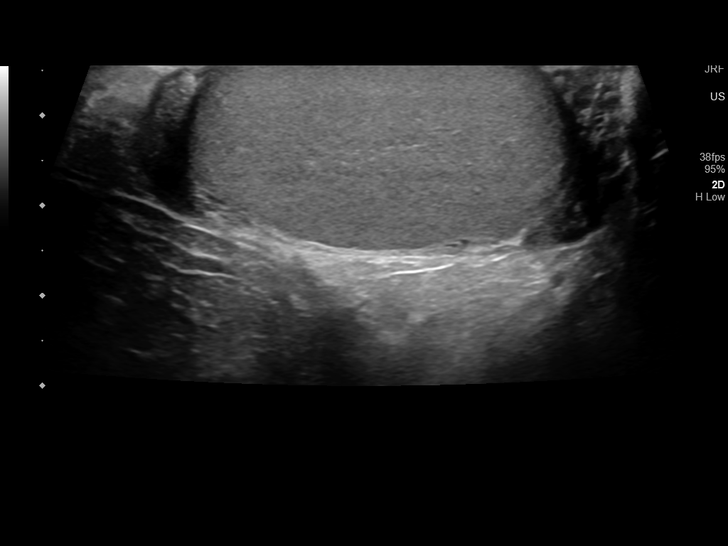
[im 42/67]
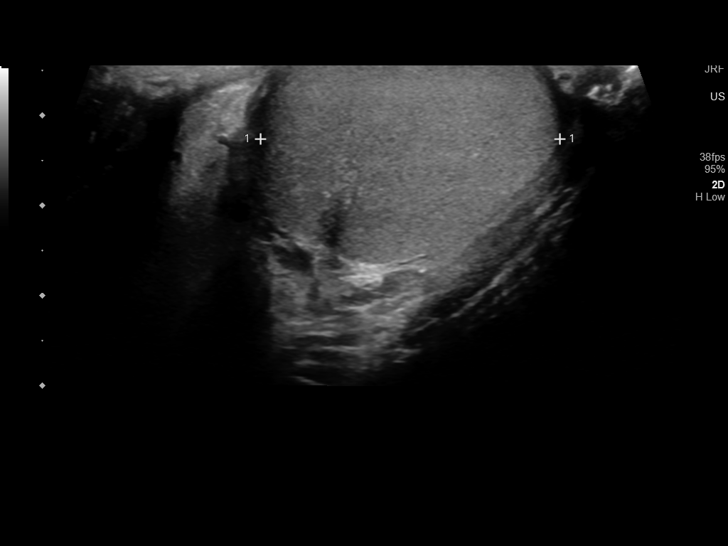
[im 45/67]
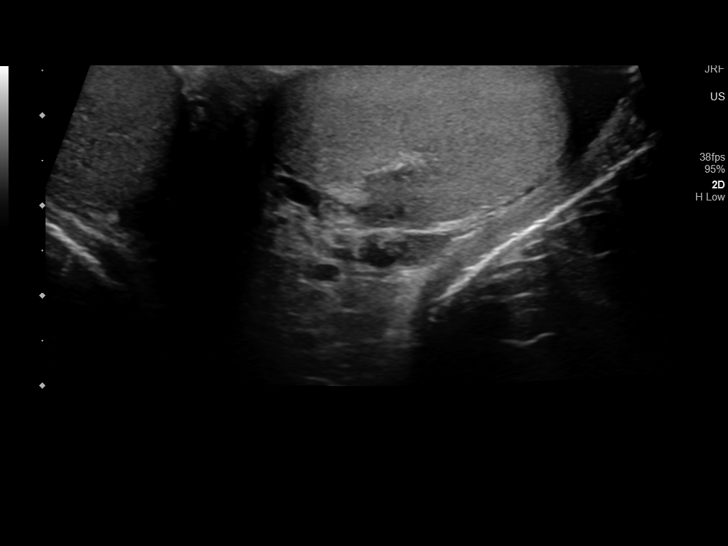
[im 50/67]
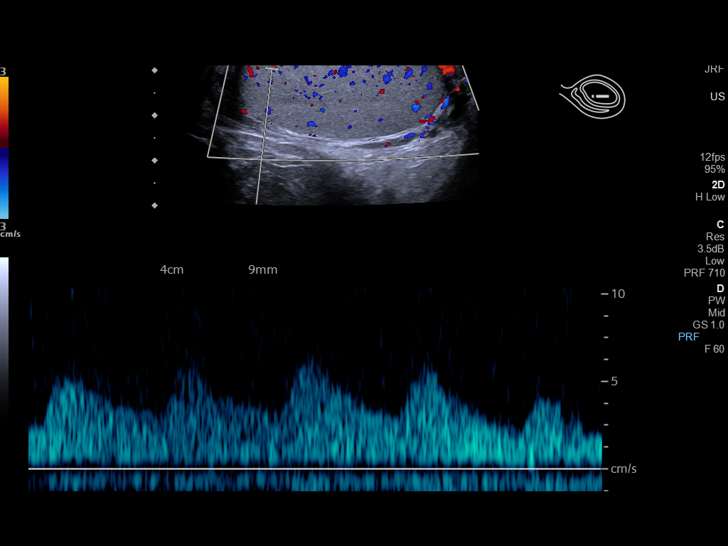
[im 56/67]
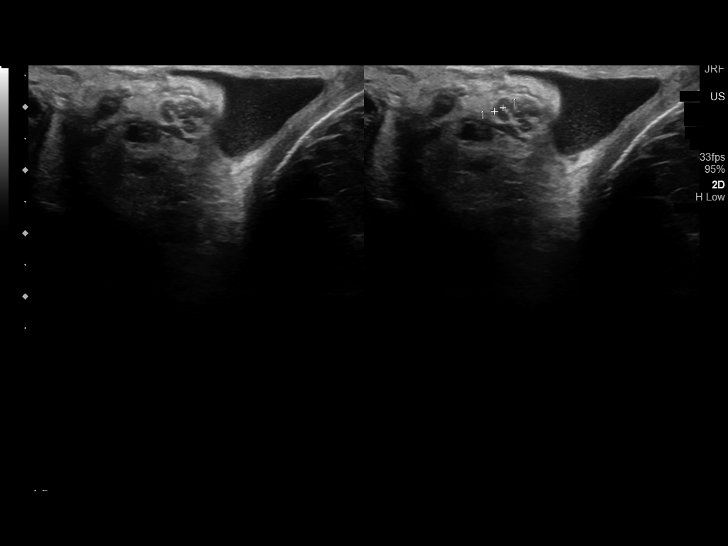
[im 61/67]
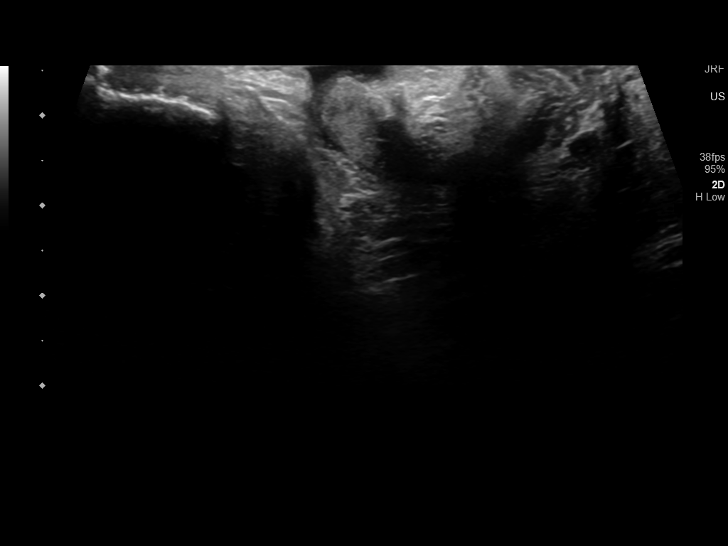
[im 67/67]
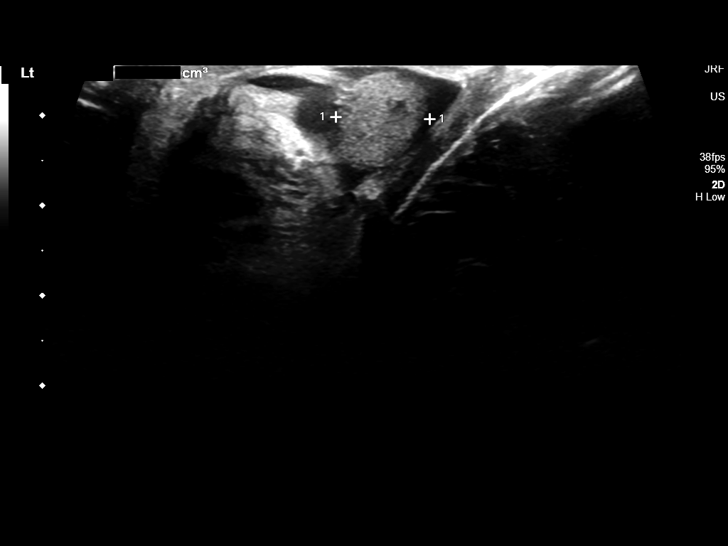

[14 of 25 positions shown; findings below may reference images not displayed]

FINDINGS: Right testicle

Measurements: 4.4 x 2.2 x 3.3 cm. No mass or microlithiasis
visualized.

Left testicle

Measurements: 4.6 x 2.2 x 3.3 cm. No mass or microlithiasis
visualized.

Right epididymis:  Normal in size and appearance.

Left epididymis:  Normal in size and appearance.

Hydrocele: None visualized. Physiologic fluid within the scrotal sac
bilaterally.

Varicocele:  None visualized.

Pulsed Doppler interrogation of both testes demonstrates normal low
resistance arterial and venous waveforms bilaterally.
IMPRESSION: Normal testicular sonogram.
